# Patient Record
Sex: Female | Born: 2000 | Race: White | Hispanic: No | Marital: Single | Smoking: Never smoker
Health system: Southern US, Community
[De-identification: ages and names within clinical notes are randomized; demographics above are authoritative.]

## PROBLEM LIST (undated history)

## (undated) DIAGNOSIS — F909 Attention-deficit hyperactivity disorder, unspecified type: Secondary | ICD-10-CM

## (undated) DIAGNOSIS — F32A Depression, unspecified: Secondary | ICD-10-CM

---

## 2021-11-21 ENCOUNTER — Emergency Department
Admission: EM | Admit: 2021-11-21 | Discharge: 2021-11-21 | Disposition: A | Discharge status: Other Acute Inpatient Hospital | Payer: Medicaid Other | Attending: Emergency Medicine | Admitting: Emergency Medicine

## 2021-11-21 ENCOUNTER — Other Ambulatory Visit: Payer: Self-pay

## 2021-11-21 DIAGNOSIS — Z91010 Allergy to peanuts: Secondary | ICD-10-CM | POA: Insufficient documentation

## 2021-11-21 DIAGNOSIS — J029 Acute pharyngitis, unspecified: Secondary | ICD-10-CM | POA: Insufficient documentation

## 2021-11-21 MED ORDER — ACETAMINOPHEN 500 MG PO TABS
1000.0000 mg | ORAL_TABLET | Freq: Once | ORAL | Status: AC
  Administered 2021-11-21: 1000 mg via ORAL
  Filled 2021-11-21: qty 2

## 2021-11-21 NOTE — ED Provider Notes (Signed)
? ?[facility] ?Provider Note ? ? ? Event Date/Time  ? First MD Initiated Contact with Patient 11/21/21 1834   ?  (approximate) ? ? ?History  ? ?Allergic Reaction ? ? ?HPI ? ?Deborah Hanson is a 21 y.o. female with a past medical history of autism spectrum disorder, depression, anxiety and reported known peanut allergy who presents for evaluation of sore throat.  Patient states she thinks she was from a cracker she had yesterday had peanut butter on it.  It seems she did not make caregivers aware of until today.  She denies any other clear associated sick symptoms including nausea, vomiting, diarrhea, abdominal pain, chest pain, cough, shortness of breath, wheezing, rash or any itching.  Denies any earache, headache or any other sick symptoms other than sore throat which she states started yesterday around when she ate the cracker with peanut butter.  No medications prior to arrival.  No other concerns at this time from caregiver at bedside. ? ?  ? ? ?Physical Exam  ?Triage Vital Signs: ?ED Triage Vitals  ?Enc Vitals Group  ?   BP 11/21/21 1825 (!) 146/85  ?   Pulse Rate 11/21/21 1825 93  ?   Resp 11/21/21 1825 17  ?   Temp 11/21/21 1825 98 ?F (36.7 ?C)  ?   Temp src --   ?   SpO2 11/21/21 1825 100 %  ?   Weight --   ?   Height 11/21/21 1824 5' (1.524 m)  ?   Head Circumference --   ?   Peak Flow --   ?   Pain Score 11/21/21 1823 0  ?   Pain Loc --   ?   Pain Edu? --   ?   Excl. in [place]? --   ? ? ?Most recent vital signs: ?Vitals:  ? 11/21/21 1825  ?BP: (!) 146/85  ?Pulse: 93  ?Resp: 17  ?Temp: 98 ?F (36.7 ?C)  ?SpO2: 100%  ? ? ?General: Awake, no distress.  ?CV:  Good peripheral perfusion.  ?Resp:  Normal effort.  Clear bilaterally without any wheezing rhonchi or rales.  There is no stridor over the neck. ?Abd:  No distention.  Soft throughout. ?Other:  No rash visible in the upper chest neck lower extremities.  Oropharynx is some mild posterior oropharyngeal erythema but otherwise is  unremarkable. ? ? ?ED Results / Procedures / Treatments  ?Labs ?(all labs ordered are listed, but only abnormal results are displayed) ?Labs Reviewed  ?GROUP A STREP BY PCR  ? ? ? ?EKG ? ? ?RADIOLOGY ? ? ? ?PROCEDURES: ? ?Critical Care performed: No ? ?Procedures ? ? ?MEDICATIONS ORDERED IN ED: ?Medications  ?acetaminophen (TYLENOL) tablet 1,000 mg (1,000 mg Oral Given 11/21/21 1924)  ? ? ? ?IMPRESSION / MDM / ASSESSMENT AND PLAN / ED COURSE  ?I reviewed the triage vital signs and the nursing notes. ?             ?               ? ?Differential diagnosis includes, but is not limited to possible allergic reaction although seems less likely, small posterior pharyngeal tear from short cracker that I am unable to visualize versus possible strep or viral pharyngitis.  Patient otherwise very well-appearing and has full range of motion of her neck and have a very low suspicion for retropharyngeal abscess, peritonsillar abscess or other deep space infection head or neck.  Low suspicion at this time  for sepsis or anaphylaxis.  Patient and caregiver are not interested in strep screen.  I have a low suspicion for any other acute life-threatening process and on reassessment patient states he is feeling better.  I think it is reasonable to hold off on this as she has no fever, no exudates no cervical lymphadenopathy and overall my suspicion for strep pharyngitis is fairly low.  Advised to follow-up with PCP for any ongoing symptoms and have blood pressure rechecked.  No other acute concerns at this time.  Discharged in stable condition. ? ?  ? ? ?FINAL CLINICAL IMPRESSION(S) / ED DIAGNOSES  ? ?Final diagnoses:  ?Sore throat  ? ? ? ?Rx / DC Orders  ? ?ED Discharge Orders   ? ? None  ? ?  ? ? ? ?Note:  This document was prepared using Dragon voice recognition software and may include unintentional dictation errors. ?  ?Gilles Chiquito, MD ?11/21/21 1933 ? ?

## 2021-11-21 NOTE — ED Triage Notes (Signed)
Patient to ER via POV with caregiver. Patient reports she has an allergy to peanut butter and ate a peanut butter cracker yesterday. Reports now feeling bad. Patient reports having a bad headache and a sore throat. Denies rash or shortness of breath.  ?

## 2021-11-21 NOTE — ED Notes (Signed)
Pt and caregiver expressing wish to decline strep test and leave. This RN asked if she could give me a few minutes to speak to doctor and obtain D/C papers.  Caregiver agreed.  Pt states that throat feels better, Vital WNL, PT NAD at this time. ?

## 2021-11-21 NOTE — ED Notes (Signed)
Caregiver/ group home staff at [facility]. Verbalizes gave pt peanut butter crackers today. No h/o reaction to the same. Caregiver verbalizes this may be behavioral as she was mad at caregiver, but caregiver wante dto err on the side of precaution. Pt alert, NAD, calm, interactive, skin W&D, resps e/u, LS CTA, throat unremarkable, skin w/o rash or urticaria. Pt c/o mild sob, mild itching and mild sore throat.  ?

## 2021-11-27 ENCOUNTER — Ambulatory Visit
Admission: EM | Admit: 2021-11-27 | Discharge: 2021-11-27 | Disposition: A | Discharge status: Home / Self Care | Payer: Medicaid Other | Attending: Emergency Medicine | Admitting: Emergency Medicine

## 2021-11-27 ENCOUNTER — Other Ambulatory Visit: Payer: Self-pay

## 2021-11-27 DIAGNOSIS — J302 Other seasonal allergic rhinitis: Secondary | ICD-10-CM | POA: Insufficient documentation

## 2021-11-27 DIAGNOSIS — J029 Acute pharyngitis, unspecified: Secondary | ICD-10-CM | POA: Diagnosis present

## 2021-11-27 DIAGNOSIS — R490 Dysphonia: Secondary | ICD-10-CM | POA: Insufficient documentation

## 2021-11-27 DIAGNOSIS — R051 Acute cough: Secondary | ICD-10-CM | POA: Insufficient documentation

## 2021-11-27 DIAGNOSIS — F840 Autistic disorder: Secondary | ICD-10-CM | POA: Insufficient documentation

## 2021-11-27 HISTORY — DX: Autistic disorder: F84.0

## 2021-11-27 LAB — POCT RAPID STREP A (OFFICE): Rapid Strep A Screen: NEGATIVE

## 2021-11-27 MED ORDER — CETIRIZINE HCL 10 MG PO TABS: 10.0000 mg | ORAL_TABLET | Freq: Every day | ORAL | 1 refills | Status: DC

## 2021-11-27 MED ORDER — AZITHROMYCIN 250 MG PO TABS: ORAL_TABLET | ORAL | 0 refills | Status: AC

## 2021-11-27 MED ORDER — FLUTICASONE PROPIONATE 50 MCG/ACT NA SUSP: 2.0000 | Freq: Every day | NASAL | 1 refills | Status: AC

## 2021-11-27 NOTE — ED Provider Notes (Signed)
?[facility] ? ? ? ?CSN: [id number] ?Arrival date & time: 11/27/21  1008 ?  ? ?HISTORY  ? ?Chief Complaint  ?Patient presents with  ? Cough  ? Sore Throat  ? ?HPI ?Deborah Hanson is a 21 y.o. female with a past medical history of autism spectrum disorder, depression, anxiety and reported known peanut allergy who presents for evaluation of sore throat.  Patient was seen at [facility] ED 6 days ago with a chief complaint of sore throat.  At that ED visit, patient reported eating a cracker with peanut butter on it thinking that this was the cause of her sore throat.  At that time, patient denied nausea, vomiting, diarrhea, abdominal pain, chest pain, cough, shortness of breath, wheezing, rash or itching.  Patient also denied earache, headache or any other sick symptoms other than sore throat which she is dated began the day prior to being seen at [facility].  No testing was performed and no medications were prescribed at that ED visit due to patient and caregiver needing to leave before these could be done. ? ?Patient presents to urgent care today with her caregiver, patient reports that in addition to the sore throat which never improved, she is now having a cough and hoarseness of voice for the past 3 days.  Patient is not taking medications for her current symptoms.  Caregiver states she is unaware of patient having fever. ? ?The history is provided by the patient.  ?Past Medical History:  ?Diagnosis Date  ? Autism spectrum disorder 11/27/2021  ? ?Patient Active Problem List  ? Diagnosis Date Noted  ? Autism spectrum disorder 11/27/2021  ? ?History reviewed. No pertinent surgical history. ?OB History   ?No obstetric history on file. ?  ? ?Home Medications   ? ?Prior to Admission medications   ?Not on File  ? ?Family History ?History reviewed. No pertinent family history. ?Social History ?  ?Allergies   ?Peanut oil ? ?Review of Systems ?Review of Systems ?Pertinent findings noted in history of present illness.   ? ?Physical Exam ?Triage Vital Signs ?ED Triage Vitals  ?Enc Vitals Group  ?   BP 07/10/21 0827 (!) 147/82  ?   Pulse Rate 07/10/21 0827 72  ?   Resp 07/10/21 0827 18  ?   Temp 07/10/21 0827 98.3 ?F (36.8 ?C)  ?   Temp Source 07/10/21 0827 Oral  ?   SpO2 07/10/21 0827 98 %  ?   Weight --   ?   Height --   ?   Head Circumference --   ?   Peak Flow --   ?   Pain Score 07/10/21 0826 5  ?   Pain Loc --   ?   Pain Edu? --   ?   Excl. in [place]? --   ?No data found. ? ?Updated Vital Signs ?BP 135/80 (BP Location: Left Arm)   Pulse (!) 101   Temp 98.4 ?F (36.9 ?C) (Oral)   Resp 18   LMP  (LMP Unknown)   SpO2 96%  ? ?Physical Exam ?Constitutional:   ?   General: She is awake. She is not in acute distress. ?   Appearance: Normal appearance. She is well-developed. She is obese. She is ill-appearing. She is not toxic-appearing or diaphoretic.  ?HENT:  ?   Head: Normocephalic and atraumatic.  ?   Jaw: There is normal jaw occlusion.  ?   Salivary Glands: Right salivary gland is not diffusely enlarged or tender. Left  salivary gland is not diffusely enlarged or tender.  ?   Right Ear: Hearing, tympanic membrane, ear canal and external ear normal.  ?   Left Ear: Hearing, tympanic membrane, ear canal and external ear normal.  ?   Nose: Mucosal edema, congestion and rhinorrhea present. Rhinorrhea is clear.  ?   Right Turbinates: Enlarged.  ?   Left Turbinates: Enlarged.  ?   Right Sinus: No maxillary sinus tenderness or frontal sinus tenderness.  ?   Left Sinus: No maxillary sinus tenderness.  ?   Mouth/Throat:  ?   Mouth: Mucous membranes are moist.  ?   Pharynx: Uvula midline. Pharyngeal swelling, oropharyngeal exudate, posterior oropharyngeal erythema and uvula swelling present.  ?   Tonsils: No tonsillar exudate. 0 on the right. 0 on the left.  ?Cardiovascular:  ?   Rate and Rhythm: Regular rhythm. Tachycardia present.  ?   Pulses: Normal pulses.  ?Pulmonary:  ?   Effort: Pulmonary effort is normal. No tachypnea, bradypnea,  accessory muscle usage, prolonged expiration, respiratory distress or retractions.  ?   Breath sounds: No stridor. No wheezing, rhonchi or rales.  ?   Comments: Turbulent breath sounds throughout without wheeze, rale, rhonchi. ?Abdominal:  ?   General: Abdomen is flat. Bowel sounds are normal.  ?   Palpations: Abdomen is soft.  ?Musculoskeletal:     ?   General: Normal range of motion.  ?   Cervical back: Full passive range of motion without pain, normal range of motion and neck supple.  ?Lymphadenopathy:  ?   Cervical: No cervical adenopathy.  ?   Right cervical: No superficial or posterior cervical adenopathy. ?   Left cervical: No superficial or posterior cervical adenopathy.  ?Skin: ?   General: Skin is warm and dry.  ?Neurological:  ?   General: No focal deficit present.  ?   Mental Status: She is alert and oriented to person, place, and time.  ?   Motor: Motor function is intact.  ?   Coordination: Coordination is intact.  ?   Gait: Gait is intact.  ?   Deep Tendon Reflexes: Reflexes are normal and symmetric.  ?Psychiatric:     ?   Attention and Perception: Attention and perception normal.     ?   Mood and Affect: Mood and affect normal.     ?   Speech: Speech normal.     ?   Behavior: Behavior normal. Behavior is cooperative.     ?   Thought Content: Thought content normal.  ? ? ?Visual Acuity ?Right Eye Distance:   ?Left Eye Distance:   ?Bilateral Distance:   ? ?Right Eye Near:   ?Left Eye Near:    ?Bilateral Near:    ? ?UC Couse / Diagnostics / Procedures:  ?  ?EKG ? ?Radiology ?No results found. ? ?Procedures ?Procedures (including critical care time) ? ?UC Diagnoses / Final Clinical Impressions(s)   ?I have reviewed the triage vital signs and the nursing notes. ? ?Pertinent labs & imaging results that were available during my care of the patient were reviewed by me and considered in my medical decision making (see chart for details).   ?Final diagnoses:  ?Acute pharyngitis, unspecified etiology   ?Hoarseness of voice  ?Acute cough  ?Seasonal allergic rhinitis, unspecified trigger  ? ?Rapid strep test today is negative.  Throat culture will be performed per protocol.  Given duration of symptoms, recommend patient begin azithromycin to treat any bacterial superinfection on top  of what is most likely seasonal allergic rhinitis and possible viral infection.  Patient also advised to begin allergy medications.  Return precautions advised. ? ?ED Prescriptions   ? ? Medication Sig Dispense Auth. Provider  ? fluticasone (FLONASE) 50 MCG/ACT nasal spray Place 2 sprays into both nostrils daily. 48 mL Lynden Oxford Scales, PA-C  ? azithromycin (ZITHROMAX) 250 MG tablet Take 2 tablets (500 mg total) by mouth daily for 1 day, THEN 1 tablet (250 mg total) daily for 4 days. 6 tablet Lynden Oxford Scales, PA-C  ? cetirizine (ZYRTEC ALLERGY) 10 MG tablet Take 1 tablet (10 mg total) by mouth at bedtime. 90 tablet Lynden Oxford Scales, PA-C  ? ?  ? ?PDMP not reviewed this encounter. ? ?Pending results:  ?Labs Reviewed  ?COVID-19, FLU A+B NAA  ?CULTURE, GROUP A STREP [facility])  ?POCT RAPID STREP A (OFFICE)  ? ? ?Medications Ordered in UC: ?Medications - No data to display ? ?Disposition Upon Discharge:  ?Condition: stable for discharge home ?Home: take medications as prescribed; routine discharge instructions as discussed; follow up as advised. ? ?Patient presented with an acute illness with associated systemic symptoms and significant discomfort requiring urgent management. In my opinion, this is a condition that a prudent lay person (someone who possesses an average knowledge of health and medicine) may potentially expect to result in complications if not addressed urgently such as respiratory distress, impairment of bodily function or dysfunction of bodily organs.  ? ?Routine symptom specific, illness specific and/or disease specific instructions were discussed with the patient and/or caregiver at length.  ? ?As such, the  patient has been evaluated and assessed, work-up was performed and treatment was provided in alignment with urgent care protocols and evidence based medicine.  Patient/parent/caregiver has been advised that the patient may

## 2021-11-27 NOTE — ED Triage Notes (Addendum)
Patient identifiers and labels verified. ?Pt reports having a cough, hoarse voice and sore throat for 3 days. ?

## 2021-11-27 NOTE — Discharge Instructions (Addendum)
Your symptoms and physical exam findings are concerning for a viral respiratory infection.  You were tested for both COVID and influenza today, the result of your viral testing will be posted to your MyChart once it is complete, this typically takes 24 to 48 hours.  If there is a positive result, you will be contacted by phone with further recommendations, if any.  ? ?Your strep test today is negative.  Throat culture will be performed per our protocol.  The result of your throat culture will be posted to your MyChart once it is complete, this typically takes 3 to 5 days.  If there is a positive result, you will be contacted by phone and antibiotics will be prescribed for you. ?  ?Your symptoms and my physical exam findings are concerning for exacerbation of your underlying allergies.  I recommend that you begin allergy medications at this time to address any underlying allergies that might be worsening your symptoms as well as causing you to acquire more frequent upper respiratory infections.  I recommend that you continue allergy medications through the end of spring, around June.   ?  ? ?Please see the list below for recommended medications, dosages and frequencies to provide relief of your current symptoms:   ?  ?Azithromycin (Z-Pak): This is an antibiotic for upper respiratory infections.  Because you have had a sore throat for so many days, it is likely that any initial viral cause has evolved into a bacterial cause.  Please take 2 tablets the first day, then take 1 tablet daily every day thereafter until complete. ? ?Zyrtec (cetirizine): This is an excellent second-generation antihistamine that helps to reduce respiratory inflammatory response to environmental allergens.  In some patients, this medication can cause daytime sleepiness so I recommend that you take 1 tablet daily at bedtime.   ?  ?Flonase (fluticasone): This is a steroid nasal spray that you use once daily, 1 spray in each nare.  This medication  does not work well if you decide to use it only used as you feel you need to, it works best used on a daily basis.  After 3 to 5 days of use, you will notice significant reduction of the inflammation and mucus production that is currently being caused by exposure to allergens, whether seasonal or environmental.  The most common side effect of this medication is nosebleeds.  If you experience a nosebleed, please discontinue use for 1 week, then feel free to resume.  I have provided you with a prescription but you can also purchase this medication over-the-counter if your insurance will not cover it. ?  ?  ?Conservative care is also recommended at this time.  This includes rest, pushing clear fluids and activity as tolerated.  Warm beverages such as teas and broths versus cold beverages/popsicles and frozen sherbet/sorbet are your choice, both warm and cold are beneficial.  You may also notice that your appetite is reduced; this is okay as long as you are drinking plenty of clear fluids.  ?  ?If you find that you have not had significant relief of your symptoms in the next 7 to 10 days, please follow-up with your primary care provider ?  ?Thank you for visiting urgent care today.  We appreciate the opportunity to participate in your care. ? ?

## 2021-11-30 ENCOUNTER — Other Ambulatory Visit: Payer: Self-pay

## 2021-11-30 ENCOUNTER — Emergency Department (HOSPITAL_COMMUNITY)
Admission: EM | Admit: 2021-11-30 | Discharge: 2021-12-01 | Disposition: A | Discharge status: Home / Self Care | Payer: Medicaid Other | Attending: Emergency Medicine | Admitting: Emergency Medicine

## 2021-11-30 ENCOUNTER — Emergency Department (HOSPITAL_COMMUNITY): Payer: Medicaid Other

## 2021-11-30 ENCOUNTER — Encounter (HOSPITAL_COMMUNITY): Payer: Self-pay

## 2021-11-30 DIAGNOSIS — F332 Major depressive disorder, recurrent severe without psychotic features: Secondary | ICD-10-CM | POA: Insufficient documentation

## 2021-11-30 DIAGNOSIS — F840 Autistic disorder: Secondary | ICD-10-CM | POA: Diagnosis not present

## 2021-11-30 DIAGNOSIS — M25511 Pain in right shoulder: Secondary | ICD-10-CM | POA: Diagnosis not present

## 2021-11-30 DIAGNOSIS — S0081XA Abrasion of other part of head, initial encounter: Secondary | ICD-10-CM | POA: Insufficient documentation

## 2021-11-30 DIAGNOSIS — R45851 Suicidal ideations: Secondary | ICD-10-CM | POA: Diagnosis not present

## 2021-11-30 DIAGNOSIS — Z91010 Allergy to peanuts: Secondary | ICD-10-CM | POA: Insufficient documentation

## 2021-11-30 DIAGNOSIS — S00511A Abrasion of lip, initial encounter: Secondary | ICD-10-CM | POA: Diagnosis not present

## 2021-11-30 DIAGNOSIS — S0990XA Unspecified injury of head, initial encounter: Secondary | ICD-10-CM | POA: Diagnosis present

## 2021-11-30 DIAGNOSIS — M25531 Pain in right wrist: Secondary | ICD-10-CM | POA: Diagnosis not present

## 2021-11-30 DIAGNOSIS — W228XXA Striking against or struck by other objects, initial encounter: Secondary | ICD-10-CM | POA: Insufficient documentation

## 2021-11-30 LAB — COMPREHENSIVE METABOLIC PANEL
ALT: 24 U/L (ref 0–44)
AST: 25 U/L (ref 15–41)
Albumin: 4.3 g/dL (ref 3.5–5.0)
Alkaline Phosphatase: 49 U/L (ref 38–126)
Anion gap: 12 (ref 5–15)
BUN: 8 mg/dL (ref 6–20)
CO2: 25 mmol/L (ref 22–32)
Calcium: 9.4 mg/dL (ref 8.9–10.3)
Chloride: 100 mmol/L (ref 98–111)
Creatinine, Ser: 0.59 mg/dL (ref 0.44–1.00)
GFR, Estimated: >60 mL/min (ref >60)
Glucose, Bld: 116 mg/dL — ABNORMAL HIGH (ref 70–99)
Potassium: 4 mmol/L (ref 3.5–5.1)
Sodium: 137 mmol/L (ref 135–145)
Total Bilirubin: 0.3 mg/dL (ref 0.3–1.2)
Total Protein: 8.1 g/dL (ref 6.5–8.1)

## 2021-11-30 LAB — CULTURE, GROUP A STREP (THRC)

## 2021-11-30 LAB — SALICYLATE LEVEL: Salicylate Lvl: <7 mg/dL — ABNORMAL LOW (ref 7.0–30.0)

## 2021-11-30 LAB — I-STAT BETA HCG BLOOD, ED (MC, WL, AP ONLY): I-stat hCG, quantitative: <5 m[IU]/mL (ref <5)

## 2021-11-30 LAB — CBC
HCT: 41.8 % (ref 36.0–46.0)
Hemoglobin: 13.2 g/dL (ref 12.0–15.0)
MCH: 28 pg (ref 26.0–34.0)
MCHC: 31.6 g/dL (ref 30.0–36.0)
MCV: 88.6 fL (ref 80.0–100.0)
Platelets: 362 10*3/uL (ref 150–400)
RBC: 4.72 MIL/uL (ref 3.87–5.11)
RDW: 13.4 % (ref 11.5–15.5)
WBC: 6 10*3/uL (ref 4.0–10.5)
nRBC: 0 % (ref 0.0–0.2)

## 2021-11-30 LAB — ETHANOL: Alcohol, Ethyl (B): <10 mg/dL (ref <10)

## 2021-11-30 LAB — ACETAMINOPHEN LEVEL: Acetaminophen (Tylenol), Serum: <10 ug/mL — ABNORMAL LOW (ref 10–30)

## 2021-11-30 NOTE — ED Notes (Signed)
Caregiver would like a call regarding if her client will need to be discharged or not so that she can pick her up ?Deborah Hanson : [phone] ?
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
## 2021-12-01 LAB — COVID-19, FLU A+B NAA
Influenza A, NAA: NOT DETECTED
Influenza B, NAA: NOT DETECTED
SARS-CoV-2, NAA: NOT DETECTED

## 2021-12-01 MED ORDER — ACETAMINOPHEN 325 MG PO TABS
650.0000 mg | ORAL_TABLET | Freq: Once | ORAL | Status: AC
  Administered 2021-12-01: 650 mg via ORAL
  Filled 2021-12-01: qty 2

## 2021-12-01 NOTE — ED Notes (Addendum)
Pt. Unable to give urine at this time. 
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
## 2021-12-01 NOTE — Discharge Instructions (Signed)
For your behavioral health needs you are advised to continue treatment with [facility].  Your next appointment is scheduled for December 08, 2019: ? ?     [facility] ?     [address] ?     [place], [place] [address] ?     [phone] ? ? ?

## 2021-12-01 NOTE — ED Notes (Signed)
Pt.made aware of urine need ?

## 2021-12-01 NOTE — ED Notes (Addendum)
Pt. In burgundy scrubs and wanded by secuirty.pt. has 1 belongings bag. Pt. Has 1 pr. Black flip flops, 1 pr. White socks, 1 white sports bra, 1 blue and 1 gray pant. Pt. Has no cell phone or purse. Pt. Belongings locked up in cabinet behind the nurses station between [address] and [address]. ?

## 2021-12-01 NOTE — BH Assessment (Signed)
BHH Assessment Progress Note ?  ?Per Caryn Bee, NP , this voluntary pt does not require psychiatric hospitalization at this time.  Pt is psychiatrically cleared.  Pt reportedly has an appointment scheduled for December 07, 2021 at [facility].  Discharge instructions advise her to keep this appointment.  I have spoken to Damian Leavell at pt's residential facility.  He reports that staff will be here to pick pt up at 17:00 and will bring a copy of letter of guardianship identifying Deborah Hanson as guardian.  At 16:32 I called the guardian to notify her.  Call rolled to voice mail and I left a message.  EDP Kristine Royal, MD and pt's nurse, Casimiro Needle, have been notified. ? ?[name], MA ?Triage Specialist ?[phone] ? ?

## 2021-12-01 NOTE — BH Assessment (Addendum)
Disposition:  ? ?TTS assessment completed. Discussed clinicals with the [facility] provider Caryn Bee, DNP) and discharge has been recommended at this time. Patient is psych cleared to return to her residential home [facility]). ? ?I spoke to the Director of [employer], Damian Leavell #[phone], and he is on board with patient's discharge and return to the residential home. Although, Sharlyne Pacas is the director, [facility] [phone] is the direct caregiver. Brekyn lives in the home with [place].  ? ?Marylee Floras is agreeable to pick patient up from [facility], at 5pm today. Patient has expressed multiple times in my assessment that she doesn't want to live with Marylee Floras anymore. FYI: Patient may display some acting out behaviors as the result of being discharged back to [facility]. Due to patient's diagnosis of Autism, patient has significant issues with transitioning environment to environment and sometimes needs additional support.  ? ?I have discussed with Marylee Floras and Sharlyne Pacas recommendations for follow up treatment options. Patient has Medicaid with [facility]. Therefore, patient is recommended to follow up with outpatient services. She may be a candidate for providers that provide ACTT services, [employer], and outpatient therapy under the [facility]. I encouraged Marylee Floras and Sharlyne Pacas to follow up with patient's Care Coordinator @ [facility] to link appropriate services. Patient already has a medication management appointment scheduled with [facility] December 07, 2021 and a Care Coordinator with [facility]. Tom, please note these recommendations on patient's AVS for discharge. In addition, to [facility]'s crises services.  ? ?I have contacted the Department of Social Services with [place] to provide updates to patient's guardian: @1345 , Contacted patient's guardian as listed on her face sheet, Rosetta) [phone], no answer. Therefore, left a HIPPA compliant  voicemail, requested a return call regarding a mutual patient. @1353 , Contacted patient's guardian as listed on her face sheet, #[phone], no answer. Therefore, left a HIPPA compliant voicemail. Requested a return call regarding a mutual patient. The voicemails for Cherlyn Roberts and #[phone] both listed an Emergency number to call #[phone] if there is a need to contact a representative. Clinician called the number provider, spoke to the switch board operator, ?Genie? @1358 . She will contact patient's designated guardian. However, she did confirm that Cherlyn Roberts is out on medical leave for an extended amount of time. Also, verified that [phone] is not patient's direct guardian, however; the supervisor for all the guardians at the [facility]. Clinician is awaiting a return call.   ?

## 2021-12-01 NOTE — BH Assessment (Signed)
@  1254, requested patient's nurse Levada Dy, RN) to set up the TTS machine for patient's initial TTS assessment.  ?

## 2022-02-16 ENCOUNTER — Encounter: Payer: Self-pay | Admitting: Emergency Medicine

## 2022-02-16 ENCOUNTER — Ambulatory Visit
Admission: EM | Admit: 2022-02-16 | Discharge: 2022-02-16 | Disposition: A | Discharge status: Home / Self Care | Payer: Medicaid Other

## 2022-02-16 DIAGNOSIS — R4586 Emotional lability: Secondary | ICD-10-CM | POA: Diagnosis not present

## 2022-02-16 DIAGNOSIS — R040 Epistaxis: Secondary | ICD-10-CM

## 2022-02-16 DIAGNOSIS — R638 Other symptoms and signs concerning food and fluid intake: Secondary | ICD-10-CM

## 2022-02-16 NOTE — ED Provider Notes (Signed)
[facility]    CSN: [id number] Arrival date & time: 02/16/22  0920      History   Chief Complaint Chief Complaint  Patient presents with   Anorexia   Epistaxis    HPI Deanne Bedgood is a 21 y.o. female.  She is here with her legal guardian Marylee Floras.  Archie Patten brings Oral here today for concerns of nosebleed and decreased appetite and concern for constipation and dehydration.  Guardian reports that about a month ago, patient's medications were changed by her psychiatrist and since then, patient has had decreased appetite and interest in eating, decreased oral intake including fluids, and change in behavior to be more weepy.  Patient has an appointment with psychiatrist in 2 days to address these concerns.  Patient also saw her PCP last week and had a variety of lab tests done, guardian reports they have an appointment tomorrow to get results and discuss plan of care with PCP.  Guardian reports that to her knowledge, patient does not have bowel movements and she is worried the patient has constipation.  The guardian reports her urine is dark orange color.  She was able to get patient to drink some Pedialyte last night.  Guardian is also concerned because patient recently had a nosebleed.  Nosebleed was not a copious amount of blood just a little amount of blood but it is new guardian is concerned.  Guardian also reports that patient is picking at her skin frequently which is a change since her medications were changed a month ago.  I attempted to ask patient some questions about her symptoms but patient answered yes to every question and I suspect she is not able to reliably tell me how she is feeling.   Epistaxis  Past Medical History:  Diagnosis Date   Autism spectrum disorder 11/27/2021    Patient Active Problem List   Diagnosis Date Noted   Autism spectrum disorder 11/27/2021    No past surgical history on file.  OB History   No obstetric history on file.      Home  Medications    Prior to Admission medications   Medication Sig Start Date End Date Taking? Authorizing Provider  ARIPiprazole (ABILIFY) 20 MG tablet Take 20 mg by mouth at bedtime.    [provider]  benztropine (COGENTIN) 0.5 MG tablet Take 0.5 mg by mouth at bedtime as needed for tremors (extrapyramidal symptoms).    [provider]  cetirizine (ZYRTEC ALLERGY) 10 MG tablet Take 1 tablet (10 mg total) by mouth at bedtime. Patient taking differently: Take 10 mg by mouth daily. 11/27/21 05/26/22  Theadora Rama Scales, PA-C  divalproex (DEPAKOTE) 250 MG DR tablet Take 250 mg by mouth 2 (two) times daily.    [provider]  FLUoxetine (PROZAC) 20 MG capsule Take by mouth daily. 01/08/22   [provider]  fluticasone (FLONASE) 50 MCG/ACT nasal spray Place 2 sprays into both nostrils daily. Patient not taking: Reported on 11/30/2021 11/27/21   Theadora Rama Scales, PA-C  haloperidol (HALDOL) 5 MG tablet Take 7.5 mg by mouth daily. 01/08/22   [provider]  hydrOXYzine (ATARAX) 50 MG tablet Take 50 mg by mouth 3 (three) times daily as needed for anxiety.    [provider]  hydrOXYzine (VISTARIL) 50 MG capsule Take 50 mg by mouth 2 (two) times daily. 01/28/22   [provider]  LORazepam (ATIVAN) 0.5 MG tablet Take 0.5 mg by mouth 3 (three) times daily as needed. 01/28/22  [provider]  traZODone (DESYREL) 50 MG tablet Take 75 mg by mouth at bedtime.    [provider]    Family History No family history on file.  Social History Social History   Tobacco Use   Smoking status: Never   Smokeless tobacco: Never  Substance Use Topics   Alcohol use: Never   Drug use: Never     Allergies   Lactose intolerance (gi) and Peanut oil   Review of Systems Review of Systems  HENT:  Positive for nosebleeds.     Physical Exam Triage Vital Signs ED Triage Vitals [02/16/22 0940]  Enc Vitals Group     BP (!)  117/92     Pulse Rate 100     Resp 20     Temp 97.8 F (36.6 C)     Temp src      SpO2 94 %     Weight      Height      Head Circumference      Peak Flow      Pain Score      Pain Loc      Pain Edu?      Excl. in GC?    No data found.  Updated Vital Signs BP (!) 117/92   Pulse 100   Temp 97.8 F (36.6 C)   Resp 20   LMP  (LMP Unknown)   SpO2 94%   Visual Acuity Right Eye Distance:   Left Eye Distance:   Bilateral Distance:    Right Eye Near:   Left Eye Near:    Bilateral Near:     Physical Exam Constitutional:      Appearance: Normal appearance.     Comments: Occasionally weepy.  HENT:     Head: Normocephalic and atraumatic.     Nose:     Comments: I am not able to use a speculum to examine patient's nasal mucosa.  Just looking at her external nares, patient does have some dried blood around the right nare and some dried nasal discharge around that nare.    Mouth/Throat:     Mouth: Mucous membranes are moist.     Comments: White debris in patient's mouth.  Not sure if this is from disintegrating medications and guardian suggests or simply from poor oral hygiene-Guardian reports she is working on a plan for oral hygiene for patient Cardiovascular:     Rate and Rhythm: Normal rate and regular rhythm.  Pulmonary:     Effort: Pulmonary effort is normal.     Breath sounds: Normal breath sounds.  Abdominal:     General: Abdomen is flat. Bowel sounds are normal.     Palpations: Abdomen is soft.     Tenderness: There is generalized abdominal tenderness. There is no guarding or rebound.  Neurological:     Mental Status: She is alert.     UC Treatments / Results  Labs (all labs ordered are listed, but only abnormal results are displayed) Labs Reviewed - No data to display  EKG   Radiology No results found.  Procedures Procedures (including critical care time)  Medications Ordered in UC Medications - No data to display  Initial Impression /  Assessment and Plan / UC Course  I have reviewed the triage vital signs and the nursing notes.  Pertinent labs & imaging results that were available during my care of the patient were reviewed by me and considered in my medical decision making (see chart for details).  No sign of any acute urgent or emergent problems.  Patient's abdomen is soft and while she tells me palpating her abdomen hurts everywhere I touch, she does not appear to be in pain or distress when I palpate her abdomen.  Mucous membranes are moist, I suspect she is sufficiently hydrated at this point.  Reviewed oral care strategies with guardian.  Discussed need to increase fluids and fiber if concern for constipation.  Discussed strategies for sneaking in fiber into diet.  Keep appointment with PCP tomorrow keep appointment with psychiatrist in 2 days.  Reviewed seeking emergent care for uncontrolled epistaxis.  Guardian is not certain but it may be that patient is picking her nose.  Final Clinical Impressions(s) / UC Diagnoses   Final diagnoses:  Alteration in appetite  Epistaxis  Mood change     Discharge Instructions      Keep follow up appointments with PCP and psychiatrist.   Occasional nosebleed with small amount of blood is probably okay.  Can happen if she is congested or if she is picking her nose.  It can also happen if her nasal tissue is dry.  Only worry if it is a large nosebleed that does not stop  Try to use mouth swabs to clean Lavita's mouth every day.  If needed you can give it in kids flavored mouthwash.  If she will drink anything else and you are concerned about dehydration, you can give her Pedialyte.  If you want to sneak some fiber into her diet, you can mix Metamucil (or Benefiber or other fiber supplement) into the Pedialyte.  Talk with her PCP about her diet.  If she truly will not eat any worried about malnutrition, you can maybe use an occasional Ensure to make sure she is getting calories  and nutrients.  You can also mix Metamucil into the Ensure if needed.  If Vasilia begins vomiting or develops high fever, seek care in the emergency room.  Otherwise keep the follow-up appointments for her scheduled     ED Prescriptions   None    PDMP not reviewed this encounter.   Cathlyn ParsonsKabbe, Doran Nestle M, NP 02/16/22 1141

## 2022-02-16 NOTE — Discharge Instructions (Signed)
Keep follow up appointments with PCP and psychiatrist.   Occasional nosebleed with small amount of blood is probably okay.  Can happen if she is congested or if she is picking her nose.  It can also happen if her nasal tissue is dry.  Only worry if it is a large nosebleed that does not stop  Try to use mouth swabs to clean Berlin's mouth every day.  If needed you can give it in kids flavored mouthwash.  If she will drink anything else and you are concerned about dehydration, you can give her Pedialyte.  If you want to sneak some fiber into her diet, you can mix Metamucil (or Benefiber or other fiber supplement) into the Pedialyte.  Talk with her PCP about her diet.  If she truly will not eat any worried about malnutrition, you can maybe use an occasional Ensure to make sure she is getting calories and nutrients.  You can also mix Metamucil into the Ensure if needed.  If Elisabella begins vomiting or develops high fever, seek care in the emergency room.  Otherwise keep the follow-up appointments for her scheduled

## 2022-02-16 NOTE — ED Triage Notes (Signed)
Pt here with recent self-harming, not eating for drinking, and frequent nose bleeds for a little over a week. Pt guardian from her group home states that a recent med change seemed to have caused these issues. Pt is autistic, but states her entire body hurts. Mucous membranes are pale and dry and she is tearful in triage.

## 2022-03-08 ENCOUNTER — Encounter (HOSPITAL_COMMUNITY): Payer: Self-pay

## 2022-03-08 ENCOUNTER — Emergency Department (HOSPITAL_COMMUNITY)
Admission: EM | Admit: 2022-03-08 | Discharge: 2022-03-08 | Disposition: A | Discharge status: Home / Self Care | Payer: Medicaid Other | Attending: Student | Admitting: Student

## 2022-03-08 ENCOUNTER — Emergency Department (HOSPITAL_COMMUNITY): Payer: Medicaid Other

## 2022-03-08 ENCOUNTER — Other Ambulatory Visit: Payer: Self-pay

## 2022-03-08 DIAGNOSIS — F840 Autistic disorder: Secondary | ICD-10-CM | POA: Diagnosis not present

## 2022-03-08 DIAGNOSIS — Z91010 Allergy to peanuts: Secondary | ICD-10-CM | POA: Diagnosis not present

## 2022-03-08 DIAGNOSIS — N3000 Acute cystitis without hematuria: Secondary | ICD-10-CM | POA: Insufficient documentation

## 2022-03-08 DIAGNOSIS — R112 Nausea with vomiting, unspecified: Secondary | ICD-10-CM | POA: Diagnosis present

## 2022-03-08 HISTORY — DX: Depression, unspecified: F32.A

## 2022-03-08 HISTORY — DX: Attention-deficit hyperactivity disorder, unspecified type: F90.9

## 2022-03-08 LAB — COMPREHENSIVE METABOLIC PANEL
ALT: 16 U/L (ref 0–44)
AST: 19 U/L (ref 15–41)
Albumin: 3.6 g/dL (ref 3.5–5.0)
Alkaline Phosphatase: 55 U/L (ref 38–126)
Anion gap: 11 (ref 5–15)
BUN: 7 mg/dL (ref 6–20)
CO2: 25 mmol/L (ref 22–32)
Calcium: 9.7 mg/dL (ref 8.9–10.3)
Chloride: 103 mmol/L (ref 98–111)
Creatinine, Ser: 0.63 mg/dL (ref 0.44–1.00)
GFR, Estimated: >60 mL/min (ref >60)
Glucose, Bld: 97 mg/dL (ref 70–99)
Potassium: 3.8 mmol/L (ref 3.5–5.1)
Sodium: 139 mmol/L (ref 135–145)
Total Bilirubin: 1 mg/dL (ref 0.3–1.2)
Total Protein: 7.2 g/dL (ref 6.5–8.1)

## 2022-03-08 LAB — URINALYSIS, ROUTINE W REFLEX MICROSCOPIC
Glucose, UA: NEGATIVE mg/dL
Hgb urine dipstick: NEGATIVE
Ketones, ur: 20 mg/dL — AB
Nitrite: NEGATIVE
Protein, ur: 100 mg/dL — AB
Specific Gravity, Urine: 1.031 — ABNORMAL HIGH (ref 1.005–1.030)
WBC, UA: >50 WBC/hpf — ABNORMAL HIGH (ref 0–5)
pH: 5 (ref 5.0–8.0)

## 2022-03-08 LAB — CBC
HCT: 41.6 % (ref 36.0–46.0)
Hemoglobin: 13.3 g/dL (ref 12.0–15.0)
MCH: 28.3 pg (ref 26.0–34.0)
MCHC: 32 g/dL (ref 30.0–36.0)
MCV: 88.5 fL (ref 80.0–100.0)
Platelets: 233 10*3/uL (ref 150–400)
RBC: 4.7 MIL/uL (ref 3.87–5.11)
RDW: 14.4 % (ref 11.5–15.5)
WBC: 7 10*3/uL (ref 4.0–10.5)
nRBC: 0 % (ref 0.0–0.2)

## 2022-03-08 LAB — I-STAT BETA HCG BLOOD, ED (MC, WL, AP ONLY): I-stat hCG, quantitative: <5 m[IU]/mL (ref <5)

## 2022-03-08 LAB — LIPASE, BLOOD: Lipase: 33 U/L (ref 11–51)

## 2022-03-08 MED ORDER — IOHEXOL 300 MG/ML  SOLN
100.0000 mL | Freq: Once | INTRAMUSCULAR | Status: AC | PRN
  Administered 2022-03-08: 80 mL via INTRAVENOUS

## 2022-03-08 MED ORDER — ONDANSETRON HCL 4 MG PO TABS: 4.0000 mg | ORAL_TABLET | Freq: Four times a day (QID) | ORAL | 0 refills | Status: AC

## 2022-03-08 MED ORDER — NITROFURANTOIN MONOHYD MACRO 100 MG PO CAPS: 100.0000 mg | ORAL_CAPSULE | Freq: Two times a day (BID) | ORAL | 0 refills | Status: AC

## 2022-03-08 MED ORDER — SODIUM CHLORIDE 0.9 % IV BOLUS
1000.0000 mL | Freq: Once | INTRAVENOUS | Status: AC
  Administered 2022-03-08: 1000 mL via INTRAVENOUS

## 2022-03-08 MED ORDER — ONDANSETRON HCL 4 MG/2ML IJ SOLN
4.0000 mg | Freq: Once | INTRAMUSCULAR | Status: AC
  Administered 2022-03-08: 4 mg via INTRAVENOUS
  Filled 2022-03-08: qty 2

## 2022-03-08 MED ORDER — FAMOTIDINE IN NACL 20-0.9 MG/50ML-% IV SOLN
20.0000 mg | Freq: Once | INTRAVENOUS | Status: AC
  Administered 2022-03-08: 20 mg via INTRAVENOUS
  Filled 2022-03-08: qty 50
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
## 2022-03-24 ENCOUNTER — Emergency Department (HOSPITAL_COMMUNITY): Payer: Medicaid Other

## 2022-03-24 ENCOUNTER — Other Ambulatory Visit: Payer: Self-pay

## 2022-03-24 ENCOUNTER — Encounter (HOSPITAL_COMMUNITY): Payer: Self-pay

## 2022-03-24 ENCOUNTER — Ambulatory Visit (HOSPITAL_COMMUNITY): Admit: 2022-03-24 | Payer: No Typology Code available for payment source

## 2022-03-24 ENCOUNTER — Emergency Department (HOSPITAL_COMMUNITY)
Admission: EM | Admit: 2022-03-24 | Discharge: 2022-03-30 | Disposition: A | Discharge status: Home / Self Care | Payer: Medicaid Other | Attending: Emergency Medicine | Admitting: Emergency Medicine

## 2022-03-24 ENCOUNTER — Ambulatory Visit (HOSPITAL_COMMUNITY)
Admission: EM | Admit: 2022-03-24 | Discharge: 2022-03-24 | Disposition: A | Discharge status: Home / Self Care | Payer: No Typology Code available for payment source | Source: Ambulatory Visit | Attending: Emergency Medicine | Admitting: Emergency Medicine

## 2022-03-24 ENCOUNTER — Ambulatory Visit (HOSPITAL_COMMUNITY)
Admission: EM | Admit: 2022-03-24 | Discharge: 2022-03-24 | Disposition: A | Discharge status: Home / Self Care | Payer: Medicaid Other | Source: Ambulatory Visit | Attending: Emergency Medicine | Admitting: Emergency Medicine

## 2022-03-24 DIAGNOSIS — F840 Autistic disorder: Secondary | ICD-10-CM | POA: Diagnosis not present

## 2022-03-24 DIAGNOSIS — S0990XA Unspecified injury of head, initial encounter: Secondary | ICD-10-CM | POA: Diagnosis present

## 2022-03-24 DIAGNOSIS — S7012XA Contusion of left thigh, initial encounter: Secondary | ICD-10-CM | POA: Insufficient documentation

## 2022-03-24 DIAGNOSIS — S8012XA Contusion of left lower leg, initial encounter: Secondary | ICD-10-CM | POA: Diagnosis not present

## 2022-03-24 DIAGNOSIS — Y9289 Other specified places as the place of occurrence of the external cause: Secondary | ICD-10-CM | POA: Diagnosis not present

## 2022-03-24 DIAGNOSIS — S7001XA Contusion of right hip, initial encounter: Secondary | ICD-10-CM | POA: Insufficient documentation

## 2022-03-24 DIAGNOSIS — S7011XA Contusion of right thigh, initial encounter: Secondary | ICD-10-CM | POA: Insufficient documentation

## 2022-03-24 DIAGNOSIS — R4689 Other symptoms and signs involving appearance and behavior: Secondary | ICD-10-CM

## 2022-03-24 DIAGNOSIS — R8289 Other abnormal findings on cytological and histological examination of urine: Secondary | ICD-10-CM | POA: Insufficient documentation

## 2022-03-24 DIAGNOSIS — S50812A Abrasion of left forearm, initial encounter: Secondary | ICD-10-CM | POA: Insufficient documentation

## 2022-03-24 DIAGNOSIS — S40212A Abrasion of left shoulder, initial encounter: Secondary | ICD-10-CM | POA: Diagnosis not present

## 2022-03-24 DIAGNOSIS — S5001XA Contusion of right elbow, initial encounter: Secondary | ICD-10-CM | POA: Diagnosis not present

## 2022-03-24 DIAGNOSIS — S8011XA Contusion of right lower leg, initial encounter: Secondary | ICD-10-CM | POA: Insufficient documentation

## 2022-03-24 DIAGNOSIS — S5002XA Contusion of left elbow, initial encounter: Secondary | ICD-10-CM | POA: Diagnosis not present

## 2022-03-24 DIAGNOSIS — S7002XA Contusion of left hip, initial encounter: Secondary | ICD-10-CM | POA: Insufficient documentation

## 2022-03-24 DIAGNOSIS — Z0441 Encounter for examination and observation following alleged adult rape: Secondary | ICD-10-CM | POA: Diagnosis present

## 2022-03-24 DIAGNOSIS — M25561 Pain in right knee: Secondary | ICD-10-CM | POA: Diagnosis not present

## 2022-03-24 DIAGNOSIS — S0181XA Laceration without foreign body of other part of head, initial encounter: Secondary | ICD-10-CM | POA: Insufficient documentation

## 2022-03-24 DIAGNOSIS — F419 Anxiety disorder, unspecified: Secondary | ICD-10-CM | POA: Diagnosis not present

## 2022-03-24 DIAGNOSIS — Z91010 Allergy to peanuts: Secondary | ICD-10-CM | POA: Diagnosis not present

## 2022-03-24 DIAGNOSIS — Z8659 Personal history of other mental and behavioral disorders: Secondary | ICD-10-CM

## 2022-03-24 LAB — URINALYSIS, ROUTINE W REFLEX MICROSCOPIC
Glucose, UA: 100 mg/dL — AB
Ketones, ur: 15 mg/dL — AB
Nitrite: POSITIVE — AB
Protein, ur: 100 mg/dL — AB
Specific Gravity, Urine: 1.025 (ref 1.005–1.030)
pH: 6.5 (ref 5.0–8.0)

## 2022-03-24 LAB — URINALYSIS, MICROSCOPIC (REFLEX)

## 2022-03-24 LAB — CBC WITH DIFFERENTIAL/PLATELET
Abs Immature Granulocytes: 0.04 10*3/uL (ref 0.00–0.07)
Basophils Absolute: 0 10*3/uL (ref 0.0–0.1)
Basophils Relative: 0 %
Eosinophils Absolute: 0.1 10*3/uL (ref 0.0–0.5)
Eosinophils Relative: 2 %
HCT: 36.4 % (ref 36.0–46.0)
Hemoglobin: 11.4 g/dL — ABNORMAL LOW (ref 12.0–15.0)
Immature Granulocytes: 1 %
Lymphocytes Relative: 41 %
Lymphs Abs: 3 10*3/uL (ref 0.7–4.0)
MCH: 28.5 pg (ref 26.0–34.0)
MCHC: 31.3 g/dL (ref 30.0–36.0)
MCV: 91 fL (ref 80.0–100.0)
Monocytes Absolute: 0.7 10*3/uL (ref 0.1–1.0)
Monocytes Relative: 10 %
Neutro Abs: 3.3 10*3/uL (ref 1.7–7.7)
Neutrophils Relative %: 46 %
Platelets: 219 10*3/uL (ref 150–400)
RBC: 4 MIL/uL (ref 3.87–5.11)
RDW: 15.6 % — ABNORMAL HIGH (ref 11.5–15.5)
WBC: 7.2 10*3/uL (ref 4.0–10.5)
nRBC: 0.4 % — ABNORMAL HIGH (ref 0.0–0.2)

## 2022-03-24 LAB — COMPREHENSIVE METABOLIC PANEL
ALT: 32 U/L (ref 0–44)
AST: 46 U/L — ABNORMAL HIGH (ref 15–41)
Albumin: 3 g/dL — ABNORMAL LOW (ref 3.5–5.0)
Alkaline Phosphatase: 70 U/L (ref 38–126)
Anion gap: 8 (ref 5–15)
BUN: 14 mg/dL (ref 6–20)
CO2: 30 mmol/L (ref 22–32)
Calcium: 9 mg/dL (ref 8.9–10.3)
Chloride: 105 mmol/L (ref 98–111)
Creatinine, Ser: 0.68 mg/dL (ref 0.44–1.00)
GFR, Estimated: >60 mL/min (ref >60)
Glucose, Bld: 91 mg/dL (ref 70–99)
Potassium: 3.6 mmol/L (ref 3.5–5.1)
Sodium: 143 mmol/L (ref 135–145)
Total Bilirubin: 0.8 mg/dL (ref 0.3–1.2)
Total Protein: 6.1 g/dL — ABNORMAL LOW (ref 6.5–8.1)

## 2022-03-24 MED ORDER — ACETAMINOPHEN 325 MG PO TABS
650.0000 mg | ORAL_TABLET | Freq: Once | ORAL | Status: AC
  Administered 2022-03-24: 650 mg via ORAL
  Filled 2022-03-24: qty 2
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
## 2022-03-24 NOTE — ED Notes (Signed)
Patient transported to X-ray 
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
## 2022-03-25 LAB — HIV ANTIBODY (ROUTINE TESTING W REFLEX): HIV Screen 4th Generation wRfx: NONREACTIVE

## 2022-03-25 LAB — RPR: RPR Ser Ql: NONREACTIVE

## 2022-03-25 MED ORDER — HALOPERIDOL 5 MG PO TABS
7.5000 mg | ORAL_TABLET | Freq: Every day | ORAL | Status: DC
  Administered 2022-03-26 – 2022-03-30 (×5): 7.5 mg via ORAL
  Filled 2022-03-25 (×6): qty 2

## 2022-03-25 MED ORDER — HYDROXYZINE HCL 25 MG PO TABS
50.0000 mg | ORAL_TABLET | Freq: Two times a day (BID) | ORAL | Status: DC
  Administered 2022-03-25 – 2022-03-30 (×10): 50 mg via ORAL
  Filled 2022-03-25 (×3): qty 1
  Filled 2022-03-25: qty 2
  Filled 2022-03-25 (×2): qty 1
  Filled 2022-03-25 (×4): qty 2
  Filled 2022-03-25 (×2): qty 1
  Filled 2022-03-25 (×3): qty 2
  Filled 2022-03-25 (×2): qty 1

## 2022-03-25 MED ORDER — BENZTROPINE MESYLATE 1 MG PO TABS
0.5000 mg | ORAL_TABLET | Freq: Every day | ORAL | Status: DC
  Administered 2022-03-25 – 2022-03-30 (×5): 0.5 mg via ORAL
  Filled 2022-03-25 (×8): qty 1

## 2022-03-25 MED ORDER — CEPHALEXIN 250 MG PO CAPS
500.0000 mg | ORAL_CAPSULE | Freq: Two times a day (BID) | ORAL | Status: AC
  Administered 2022-03-25 – 2022-03-29 (×10): 500 mg via ORAL
  Filled 2022-03-25 (×10): qty 2

## 2022-03-25 MED ORDER — DIVALPROEX SODIUM 125 MG PO CSDR
250.0000 mg | DELAYED_RELEASE_CAPSULE | Freq: Two times a day (BID) | ORAL | Status: DC
  Administered 2022-03-25 – 2022-03-30 (×11): 250 mg via ORAL
  Filled 2022-03-25 (×11): qty 2

## 2022-03-25 MED ORDER — ARIPIPRAZOLE 10 MG PO TABS
20.0000 mg | ORAL_TABLET | Freq: Every day | ORAL | Status: DC
  Administered 2022-03-25 – 2022-03-29 (×5): 20 mg via ORAL
  Filled 2022-03-25 (×6): qty 2

## 2022-03-25 MED ORDER — MIRTAZAPINE 15 MG PO TBDP
15.0000 mg | ORAL_TABLET | Freq: Every day | ORAL | Status: DC
  Administered 2022-03-25 – 2022-03-29 (×5): 15 mg via ORAL
  Filled 2022-03-25 (×9): qty 1

## 2022-03-25 MED ORDER — PANTOPRAZOLE SODIUM 40 MG PO TBEC
40.0000 mg | DELAYED_RELEASE_TABLET | Freq: Every day | ORAL | Status: DC
  Administered 2022-03-25 – 2022-03-30 (×6): 40 mg via ORAL
  Filled 2022-03-25 (×6): qty 1

## 2022-03-25 MED ORDER — TRAZODONE HCL 50 MG PO TABS
75.0000 mg | ORAL_TABLET | Freq: Every day | ORAL | Status: DC
  Administered 2022-03-25 – 2022-03-29 (×5): 75 mg via ORAL
  Filled 2022-03-25 (×5): qty 2

## 2022-03-25 MED ORDER — ARIPIPRAZOLE 10 MG PO TABS
10.0000 mg | ORAL_TABLET | Freq: Every day | ORAL | Status: DC
Start: 2022-03-25 — End: 2022-03-30
  Administered 2022-03-25 – 2022-03-30 (×6): 10 mg via ORAL
  Filled 2022-03-25 (×7): qty 1

## 2022-03-25 MED ORDER — LORAZEPAM 0.5 MG PO TABS
0.5000 mg | ORAL_TABLET | Freq: Three times a day (TID) | ORAL | Status: DC | PRN
Start: 2022-03-25 — End: 2022-03-30
  Administered 2022-03-27 – 2022-03-30 (×6): 0.5 mg via ORAL
  Filled 2022-03-25 (×6): qty 1
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
## 2022-03-25 NOTE — ED Notes (Addendum)
Pt sitting up in bed and trying to eat lunch
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
## 2022-03-25 NOTE — ED Notes (Signed)
Pt resting comfortably at this time. Social worker to return in the AM. Only small amount of dark urine output earlier in the night and was sent for testing. Pt encouraged to drink throughout the night. Further urine labwork to be sent during next void.
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
## 2022-03-26 MED ORDER — ACETAMINOPHEN 325 MG PO TABS
650.0000 mg | ORAL_TABLET | Freq: Four times a day (QID) | ORAL | Status: DC | PRN
  Administered 2022-03-27 – 2022-03-28 (×3): 650 mg via ORAL
  Filled 2022-03-26 (×4): qty 2

## 2022-03-26 MED ORDER — ACETAMINOPHEN 325 MG PO TABS
650.0000 mg | ORAL_TABLET | Freq: Once | ORAL | Status: AC
  Administered 2022-03-26: 650 mg via ORAL
  Filled 2022-03-26: qty 2

## 2022-03-26 NOTE — ED Notes (Addendum)
Pt called her grandmother Wyman Songster [phone] and spoke with her for a few min and now is requesting to call her father Neysa Hotter [phone]
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
## 2022-03-26 NOTE — ED Notes (Signed)
Pt provided a hosp bed at this time. While exchanging beds pt ambulated to the restroom without assistance

## 2022-03-26 NOTE — ED Notes (Signed)
Pt c/o diarrhea at this time

## 2022-03-26 NOTE — Progress Notes (Signed)
CSW received a call from Cynda Acres with [facility], [phone]. CSW was told she will be the point of contact for patient. Kennon Rounds stated she has a list of 19 placements patient has been in before. Kennon Rounds stated with this being said it will not be easy to find a new placement for patient. Kennon Rounds stated she is going to assign another case manager to patient and she also has 2 other people helping with finding placement. Kennon Rounds stated that they are also sending referrals off for respite care as well until placement can be found.

## 2022-03-26 NOTE — ED Notes (Signed)
Called pharmacy tech and requested the meds from ED pharm tech at this time

## 2022-03-26 NOTE — ED Notes (Signed)
Provided pt with apple sauce, graham crackers, and ice cream at this time. She is currently speaking with her father on the phone

## 2022-03-27 ENCOUNTER — Emergency Department (HOSPITAL_COMMUNITY): Payer: Medicaid Other

## 2022-03-27 LAB — URINE CULTURE: Culture: 80000 — AB

## 2022-03-27 MED ORDER — IBUPROFEN 400 MG PO TABS
400.0000 mg | ORAL_TABLET | Freq: Four times a day (QID) | ORAL | Status: DC | PRN
  Administered 2022-03-28 – 2022-03-30 (×2): 400 mg via ORAL
  Filled 2022-03-27 (×3): qty 1

## 2022-03-27 MED ORDER — LOPERAMIDE HCL 2 MG PO CAPS
2.0000 mg | ORAL_CAPSULE | Freq: Four times a day (QID) | ORAL | Status: DC | PRN
  Filled 2022-03-27: qty 1

## 2022-03-27 MED ORDER — LOPERAMIDE HCL 2 MG PO CAPS
4.0000 mg | ORAL_CAPSULE | Freq: Once | ORAL | Status: AC
  Administered 2022-03-27: 4 mg via ORAL
  Filled 2022-03-27: qty 2

## 2022-03-27 NOTE — ED Notes (Signed)
RN went to give pt her 1000 meds. Pt arousable, mumbling to RN. Pt not wanting to take medication at this time. Pt resting in bed with eyes closed, rise and fall of the pt chest noted. Pt offered lemon lime soda. Pt declined. RN will attempt to give medication again.

## 2022-03-27 NOTE — ED Notes (Signed)
Pt given snacks and a drink

## 2022-03-27 NOTE — ED Notes (Signed)
Pt interactive making jokes with RN. Pt on the phone with grandmother.

## 2022-03-27 NOTE — ED Notes (Signed)
Pt resting in bed with no complaints at this time.

## 2022-03-27 NOTE — ED Notes (Signed)
Pt resting in bed with eyes closed, rise and fall noted. No complaints at this time.

## 2022-03-27 NOTE — ED Notes (Signed)
This RN brought pt breakfast tray. Pt currently resting in bed with eyes closed, in no apparent distress. Rise and fall of pt chest noted.

## 2022-03-27 NOTE — ED Notes (Signed)
Patient assisted to restroom. We attempted to call grandma, no answer. Patient was able to leave a voicemail. No needs or concerns expressed at this time.

## 2022-03-27 NOTE — ED Notes (Signed)
Patient resting in bed. Denies any pain. Patient expressed being very happy bc father called her earlier and told he was going to visit.

## 2022-03-27 NOTE — ED Notes (Signed)
ED provider at bedside.

## 2022-03-27 NOTE — ED Notes (Signed)
RN encouraging pt to eat and drink. Pt refusing at this time.

## 2022-03-27 NOTE — ED Notes (Signed)
RN attempted to give pt her daily medication. Pt shaking her head and mumbling. Pt turned over in bed, mumbled and covered her head with blanket.

## 2022-03-27 NOTE — ED Notes (Signed)
Patient encouraged to eat dinner plate. patient reports not being hungry. Patient was able to drink a soda.  Denies any abdominal pain.
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
## 2022-03-27 NOTE — ED Notes (Signed)
At bedside with pt and she is crying wishing to talk to who she calls mom Steward Drone attempted to reach [place] and there was no answer

## 2022-03-27 NOTE — ED Notes (Signed)
Pt awake and talking. Pt allowed RN to take vitals. Pt looking through menu.

## 2022-03-27 NOTE — ED Notes (Signed)
Pt is crying and upset stating that she wants her family. Pt also c/o pain all over. Provided prn meds. Called her father for her to talk to him for a few min. Provided pt with juice at this time. Attempted to get pt situated in bed to go to sleep

## 2022-03-28 MED ORDER — ONDANSETRON 4 MG PO TBDP
4.0000 mg | ORAL_TABLET | Freq: Once | ORAL | Status: AC
  Administered 2022-03-28: 4 mg via ORAL
  Filled 2022-03-28: qty 1

## 2022-03-28 MED ORDER — ACETAMINOPHEN 160 MG/5ML PO SOLN
500.0000 mg | Freq: Four times a day (QID) | ORAL | Status: DC | PRN
  Administered 2022-03-28 – 2022-03-29 (×2): 500 mg via ORAL
  Filled 2022-03-28 (×2): qty 20.3

## 2022-03-28 NOTE — ED Notes (Signed)
Patient assisted to shower by NT.

## 2022-03-28 NOTE — ED Notes (Signed)
RN went into pt room to see how she liked her lunch. Pt stated she did not like it. RN asked if she would like to order something else. Pt stated "everything upsets my stomach." Pt is denying stomach pain, but says she gets nauseous. RN made provider aware. Order for zofran PO put in.

## 2022-03-28 NOTE — ED Notes (Signed)
Per NT, patient experienced several emotional moments during shower and stating she was "anxious". When RN asked pt how the shower went, pt stated "good".  Patient provided PRN Ativan once settled into room.
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
## 2022-03-28 NOTE — ED Notes (Signed)
While this RN was sitting with patient. Patient admitted to this RN that she hears voices telling her to kill herself. When this nurse asked if patient wants to listen to the voices, patient denied thoughts of self harm or having a plan. Patient stated, "I ignore the voices, but thought you should know."

## 2022-03-28 NOTE — ED Notes (Signed)
This RN attempted to give pt some macncheese. Patient states "I want to eat it, but I don't know if my stomach will let me." Alexa RN also attempted to get ice cream for patient.

## 2022-03-28 NOTE — ED Notes (Signed)
This RN called pt's Child psychotherapist upon pt's request. Pt asked for social worker to pick up a couple of items when she is able to come visit.

## 2022-03-28 NOTE — ED Notes (Signed)
This RN went and brought pt medication. Pt and RN went for a walk and to the bathroom. Pt then ambulated back to room. RN and pt then created a list of pt favorite things. Pt expresses anxiety about being here. Pt reassured that she is safe. Pt offered PRN anxiety medication, pt declined.
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
## 2022-03-28 NOTE — ED Notes (Signed)
Patient requested medication for her agitation. This RN gave patient her PRN ativan. Patient also requested to go to the bathroom. This RN helped patient ambulate to the bathroom. Patient also asked for some soda, which this RN gave to patient.
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
## 2022-03-29 MED ORDER — MENTHOL 3 MG MT LOZG
1.0000 | LOZENGE | OROMUCOSAL | Status: DC | PRN
Start: 2022-03-29 — End: 2022-03-30

## 2022-03-29 NOTE — Progress Notes (Signed)
TOC CSW spoke with legal guardian, Livia Snellen [phone].  CSW inquired to [place] if a placement has been found, she stated no.  Cathlean Cower stated [facility] sent out a mass referral this morning in search of a placement, and she sent out a referral to two places, but no acceptance as of now.  CSW will check in until placement is found.  Jezabel Lecker Tarpley-Carter, MSW, LCSW-A Pronouns:  She/Her/Hers [facility] Clinical Social Worker Direct Number:  [phone] [email or web address]

## 2022-03-29 NOTE — ED Notes (Signed)
Speech therapist at bedside.

## 2022-03-29 NOTE — ED Notes (Addendum)
Speech eval done with plan to manage acid reflux at this time. MD notified of plan of treatment. Pt continues to refuse food at this time. States, "I feel a sharp pain to my throat every time I swallow." Pt has not eaten breakfast, lunch or dinner at this time. MD notified.
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
## 2022-03-29 NOTE — ED Notes (Signed)
Pt legal guardian at bedside to visit.

## 2022-03-29 NOTE — ED Notes (Signed)
RN reached out to speech therapist today. Spoke to [place] with speech who states she sent out messages to other therapists but she is unsure if speech eval would be completed today. Pending speech eval for diet order.

## 2022-03-29 NOTE — Progress Notes (Signed)
CSW contacted patients legal guardian patients from [facility], Livia Snellen [phone]. CSW left a message

## 2022-03-29 NOTE — ED Notes (Signed)
Pt made a phone call and is currently taking a shower. Will continue to monitor.

## 2022-03-29 NOTE — ED Notes (Signed)
Pt is now awake and taking her pills. Showered offered at this time. Pt refused. Staff encouraged pt to take a shower. Took meds with no issues. Safety precautions maintained. Will continue to monitor.
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
## 2022-03-29 NOTE — Progress Notes (Signed)
APS legal guardian, Livia Snellen [phone] called CSW back and stated there is no placement at this time but she sent two potential residences for patient to [facility], patients LME to look into. Cathlean Cower stated she has not received any updates from [facility] at this time.  Cathlean Cower also asked if patient can get a shower and change into scrubs. Cathlean Cower stated she will be at the hospital today to visit patient.

## 2022-03-29 NOTE — ED Notes (Signed)
Pt continues to be asleep. Easy to arouse but pt is still very drowsy. Will continue to monitor.

## 2022-03-29 NOTE — ED Notes (Signed)
Pt had some difficulty swallowing pills earlier when RN gave her scheduled meds. Family came and brought pt some snacks. Pt took a bite and refused the food as pt states, "I feel like something is stuck in my throat." Pt dad also states that pt voice has changed since he last seen pt. Will notify MD to place a swallowing eval order. Pt states difficulty swallowing started yesterday only. No drooling observed. Pt is able to complete sentences when talking. Will continue to monitor.

## 2022-03-30 ENCOUNTER — Other Ambulatory Visit (HOSPITAL_COMMUNITY): Payer: Self-pay

## 2022-03-30 MED ORDER — SUCRALFATE 1 GM/10ML PO SUSP
1.0000 g | Freq: Four times a day (QID) | ORAL | Status: DC
Start: 2022-03-30 — End: 2022-03-30
  Administered 2022-03-30 (×2): 1 g via ORAL
  Filled 2022-03-30 (×3): qty 10

## 2022-03-30 MED ORDER — CETIRIZINE HCL 10 MG PO TABS
10.0000 mg | ORAL_TABLET | Freq: Every day | ORAL | 0 refills | Status: AC
  Filled 2022-03-30: qty 14, 14d supply, fill #0

## 2022-03-30 MED ORDER — ARIPIPRAZOLE 10 MG PO TABS
10.0000 mg | ORAL_TABLET | Freq: Every day | ORAL | 0 refills | Status: AC
  Filled 2022-03-30: qty 14, 14d supply, fill #0

## 2022-03-30 MED ORDER — BENZTROPINE MESYLATE 0.5 MG PO TABS
0.5000 mg | ORAL_TABLET | Freq: Every day | ORAL | 0 refills | Status: AC
  Filled 2022-03-30: qty 14, 14d supply, fill #0

## 2022-03-30 MED ORDER — TRAZODONE HCL 50 MG PO TABS
75.0000 mg | ORAL_TABLET | Freq: Every day | ORAL | 0 refills | Status: AC
  Filled 2022-03-30: qty 21, 14d supply, fill #0

## 2022-03-30 MED ORDER — MIRTAZAPINE 15 MG PO TABS
15.0000 mg | ORAL_TABLET | Freq: Every day | ORAL | 0 refills | Status: AC
Start: 2022-03-30 — End: ?
  Filled 2022-03-30: qty 14, 14d supply, fill #0

## 2022-03-30 MED ORDER — ARIPIPRAZOLE 20 MG PO TABS
20.0000 mg | ORAL_TABLET | Freq: Every evening | ORAL | 0 refills | Status: AC
  Filled 2022-03-30: qty 14, 14d supply, fill #0

## 2022-03-30 MED ORDER — HALOPERIDOL 5 MG PO TABS
7.5000 mg | ORAL_TABLET | Freq: Every day | ORAL | 0 refills | Status: AC
  Filled 2022-03-30: qty 21, 14d supply, fill #0

## 2022-03-30 MED ORDER — DIVALPROEX SODIUM 125 MG PO CSDR
250.0000 mg | DELAYED_RELEASE_CAPSULE | Freq: Two times a day (BID) | ORAL | 0 refills | Status: AC
Start: 2022-03-30 — End: ?
  Filled 2022-03-30: qty 70, 14d supply, fill #0

## 2022-03-30 MED ORDER — FAMOTIDINE 20 MG PO TABS
20.0000 mg | ORAL_TABLET | Freq: Two times a day (BID) | ORAL | Status: DC | PRN
  Administered 2022-03-30: 20 mg via ORAL
  Filled 2022-03-30: qty 1

## 2022-03-30 NOTE — ED Notes (Signed)
Pt legal guardian is now in the ED to pick up pt. Guardian was given pt TOC meds and dc papers. Pt belongings returned to pt. Cleared for discharge by Dr.Dykstra.

## 2022-03-30 NOTE — ED Notes (Signed)
Pt continues to be asleep at this time. NO acute distress observed. Will continue to monitor.

## 2022-03-30 NOTE — Progress Notes (Signed)
CSW contacted legal guardian, Joseph Art and told her patient is up for discharge and someone will need to pick up patient today. Cathlean Cower stated she will work on who is going to pick patient up and give CSW a call back.

## 2022-03-30 NOTE — [place] FL2 (Signed)
[place] MEDICAID FL2 LEVEL OF CARE SCREENING TOOL     IDENTIFICATION  Patient Name: Deborah Hanson Birthdate: 04-14-2001 Sex: female Admission Date (Current Location): 03/24/2022  [facility] and [place] Number:  Producer, television/film/video and Address:  [facility] [place], [address], [place], [place] [address]      Provider Number: [id number]  Attending Physician Name and Address:  Default, Provider, MD  Relative Name and Phone Number:  Joseph Art (Legal Guardian)   [phone]    Current Level of Care: Hospital Recommended Level of Care: Other (Comment), Family Care Home (Group home, AFL) Prior Approval Number:    Date Approved/Denied:   PASRR Number:    Discharge Plan: Other (Comment) (Group Home, AFL, Family Care Home)    Current Diagnoses: Patient Active Problem List   Diagnosis Date Noted   Autism spectrum disorder 11/27/2021    Orientation RESPIRATION BLADDER Height & Weight     Time, Self, Situation, Place  Normal Continent Weight: 145 lb 12.8 oz (66.1 kg) Height:   5'0  BEHAVIORAL SYMPTOMS/MOOD NEUROLOGICAL BOWEL NUTRITION STATUS      Continent Diet (Regular)  AMBULATORY STATUS COMMUNICATION OF NEEDS Skin   Limited Assist Verbally Normal                       Personal Care Assistance Level of Assistance  Bathing, Feeding, Dressing Bathing Assistance: Limited assistance Feeding assistance: Independent Dressing Assistance: Limited assistance     Functional Limitations Info  Sight, Hearing, Speech Sight Info: Impaired (wears glasses) Hearing Info: Adequate Speech Info: Adequate    SPECIAL CARE FACTORS FREQUENCY                       Contractures Contractures Info: Not present    Additional Factors Info  Code Status, Allergies Code Status Info: Not on file Allergies Info: Peanut Oil, Lactose Intolerance (gi)           Current Medications (03/30/2022):  This is the current hospital active medication  list Current Facility-Administered Medications  Medication Dose Route Frequency Provider Last Rate Last Admin   acetaminophen (TYLENOL) 160 MG/5ML solution 500 mg  500 mg Oral Q6H PRN Army Melia A, PA-C   500 mg at 03/29/22 0406   acetaminophen (TYLENOL) tablet 650 mg  650 mg Oral Q6H PRN Cheryll Cockayne, MD   650 mg at 03/28/22 0210   ARIPiprazole (ABILIFY) tablet 10 mg  10 mg Oral Daily Loeffler, Grace C, PA-C   10 mg at 03/30/22 1102   ARIPiprazole (ABILIFY) tablet 20 mg  20 mg Oral QHS Loeffler, Grace C, PA-C   20 mg at 03/29/22 2200   benztropine (COGENTIN) tablet 0.5 mg  0.5 mg Oral Daily Loeffler, Grace C, PA-C   0.5 mg at 03/30/22 1102   divalproex (DEPAKOTE SPRINKLE) capsule 250 mg  250 mg Oral BID Loeffler, Grace C, PA-C   250 mg at 03/30/22 1102   famotidine (PEPCID) tablet 20 mg  20 mg Oral BID PRN Melene Plan, DO   20 mg at 03/30/22 1117   haloperidol (HALDOL) tablet 7.5 mg  7.5 mg Oral Daily Loeffler, Grace C, PA-C   7.5 mg at 03/30/22 1101   hydrOXYzine (ATARAX) tablet 50 mg  50 mg Oral BID Loeffler, Grace C, PA-C   50 mg at 03/30/22 1101   ibuprofen (ADVIL) tablet 400 mg  400 mg Oral Q6H PRN Dione Booze, MD   400 mg at  03/30/22 1103   loperamide (IMODIUM) capsule 2 mg  2 mg Oral QID PRN Dione Booze, MD       LORazepam (ATIVAN) tablet 0.5 mg  0.5 mg Oral TID PRN Loeffler, Grace C, PA-C   0.5 mg at 03/30/22 1101   menthol-cetylpyridinium (CEPACOL) lozenge 3 mg  1 lozenge Oral PRN Gilda Crease, MD       mirtazapine (REMERON SOL-TAB) disintegrating tablet 15 mg  15 mg Oral QHS Loeffler, Grace C, PA-C   15 mg at 03/29/22 2201   pantoprazole (PROTONIX) EC tablet 40 mg  40 mg Oral Daily Loeffler, Grace C, PA-C   40 mg at 03/30/22 1101   sucralfate (CARAFATE) 1 GM/10ML suspension 1 g  1 g Oral QID Melene Plan, DO   1 g at 03/30/22 1256   traZODone (DESYREL) tablet 75 mg  75 mg Oral QHS Loeffler, Grace C, PA-C   75 mg at 03/29/22 2201   Current Outpatient Medications   Medication Sig Dispense Refill   ARIPiprazole (ABILIFY) 10 MG tablet Take 10 mg by mouth daily.     ARIPiprazole (ABILIFY) 20 MG tablet Take 20 mg by mouth at bedtime.     ARIPiprazole ER (ABILIFY MAINTENA) 400 MG SRER injection Inject 400 mg into the muscle every 28 (twenty-eight) days.     benztropine (COGENTIN) 0.5 MG tablet Take 0.5 mg by mouth daily.     divalproex (DEPAKOTE SPRINKLE) 125 MG capsule Take 250 mg by mouth 2 (two) times daily. Take 250 mg by mouth every morning and 750 mg at bedtime     haloperidol (HALDOL) 5 MG tablet Take 7.5 mg by mouth daily. May repeat at 1700 if needed for aggression     hydrOXYzine (VISTARIL) 50 MG capsule Take 50 mg by mouth 2 (two) times daily.     LORazepam (ATIVAN) 0.5 MG tablet Take 0.5 mg by mouth 3 (three) times daily as needed for anxiety (aggitation).     mirtazapine (REMERON SOL-TAB) 15 MG disintegrating tablet Take 15 mg by mouth at bedtime.     omeprazole (PRILOSEC) 20 MG capsule Take 20 mg by mouth daily.     traZODone (DESYREL) 50 MG tablet Take 75 mg by mouth at bedtime.     cetirizine (ZYRTEC ALLERGY) 10 MG tablet Take 1 tablet (10 mg total) by mouth at bedtime. (Patient not taking: Reported on 03/25/2022) 90 tablet 1   divalproex (DEPAKOTE) 250 MG DR tablet Take 250 mg by mouth 2 (two) times daily. (Patient not taking: Reported on 03/25/2022)     FLUoxetine (PROZAC) 20 MG capsule Take by mouth daily. (Patient not taking: Reported on 03/25/2022)     fluticasone (FLONASE) 50 MCG/ACT nasal spray Place 2 sprays into both nostrils daily. (Patient not taking: Reported on 11/30/2021) 48 mL 1   nitrofurantoin, macrocrystal-monohydrate, (MACROBID) 100 MG capsule Take 1 capsule (100 mg total) by mouth 2 (two) times daily. (Patient not taking: Reported on 03/25/2022) 10 capsule 0   ondansetron (ZOFRAN) 4 MG tablet Take 1 tablet (4 mg total) by mouth every 6 (six) hours. (Patient not taking: Reported on 03/25/2022) 12 tablet 0     Discharge  Medications: Please see discharge summary for a list of discharge medications.  Relevant Imaging Results:  Relevant Lab Results:   Additional Information    Susa Simmonds, LCSWA

## 2022-03-30 NOTE — ED Notes (Signed)
TOC is working on pt transportation and place to go home today. Pt has a legal guardian and has biological parents but pt lives in a group home and unable to go back there. Pending discharge at this time.

## 2022-03-30 NOTE — ED Notes (Signed)
Breakfast order placed ?

## 2022-03-30 NOTE — Progress Notes (Signed)
CSW received a call from legal guardian, Joseph Art who is requesting a FL2. CSW stated she can scan and email one. CSW also asked who is picking patient up. Cathlean Cower stated she does not know at this time.

## 2022-03-30 NOTE — ED Notes (Signed)
Pt states pepcid did not help with burning to throat. Refuses to eat lunch at this time. Ate a few bites of breakfast with speech therapist at bedside. Pt continues to endorse pain on swallowing. Carafate given at this time. Will continue to monitor.

## 2022-03-30 NOTE — ED Notes (Signed)
Pepcid PO given for acid reflux. Speech therapist at bedside this time assessing pt swallowing. Will continue to monitor.

## 2022-03-30 NOTE — ED Notes (Signed)
Speech therapist at bedside this time to assess pt swallowing. Recommended to get MD to order meds for acid reflux. Will notify Dr.Floyd. Will continue to monitor.

## 2022-03-30 NOTE — ED Notes (Signed)
Waiting for TOC meds from pharmacy. SW states pt legal guardian is coming to pick up pt.

## 2022-03-30 NOTE — ED Provider Notes (Addendum)
Brief update note  Ms. Deborah Hanson is a 21 year old lady who has been both medically cleared and psychiatrically cleared, awaiting pickup from family.  The nurse requested I evaluate patient regarding ongoing difficulty swallowing.  I reviewed Dr. Piedad Climes note from earlier today.  He states patient is ready for discharge both medically and psychiatrically.  I also reviewed SLP notes which document patient has tolerated p.o. albeit small portions and quantities.  On physical examination, her throat appears grossly normal, she does not have any obvious swelling or erythema or exudates in her oropharynx.  Her neck is supple and does not have any overlying erythema or swelling.  Personally witnessed patient drinking small sips of liquid without difficulty.  Out of an abundance of precaution while we are waiting for guardian to arrive, will check CT soft tissue neck for further investigation given this persistent symptom.  Based on my physical exam and review of SLP notes I still believe patient can be discharged this evening.  Would recommend follow-up with PCP, GI and ENT.  We will place this in her discharge paperwork as well.   Guardians have arrived and ready to take patient home. I reassessed patient, she remains well appearing. Based on SLP eval, physical exam, and current ability to tolerate PO, I believe she can continue further work up for this medical complaint on an out patient basis.   Milagros Loll, MD 03/30/22 1759
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
## 2022-03-30 NOTE — ED Notes (Signed)
MD came at bedside to assess pt throat as pt continues to endorse difficulty swallowing. Will continue to monitor.

## 2022-03-30 NOTE — ED Notes (Signed)
Reached out to SW at this time regarding discharge dispo. Waiting for call back from pt legal guardian regarding dispo. Will continue to monitor.

## 2022-05-18 ENCOUNTER — Ambulatory Visit (INDEPENDENT_AMBULATORY_CARE_PROVIDER_SITE_OTHER): Payer: Medicaid Other | Admitting: Pediatrics

## 2022-05-21 NOTE — Progress Notes (Signed)
Opened in error- patient hospitalized and unable to make appointment

## 2022-05-22 ENCOUNTER — Ambulatory Visit (HOSPITAL_COMMUNITY)
Admission: AD | Admit: 2022-05-22 | Discharge: 2022-05-22 | Disposition: A | Discharge status: Home / Self Care | Payer: No Typology Code available for payment source | Attending: Psychiatry | Admitting: Psychiatry

## 2022-05-22 ENCOUNTER — Ambulatory Visit (HOSPITAL_COMMUNITY)
Admission: AD | Admit: 2022-05-22 | Payer: Medicaid Other | Source: Other Acute Inpatient Hospital | Admitting: Psychiatry

## 2022-05-22 ENCOUNTER — Ambulatory Visit (HOSPITAL_COMMUNITY)
Admission: EM | Admit: 2022-05-22 | Discharge: 2022-05-22 | Disposition: A | Discharge status: Home / Self Care | Payer: No Typology Code available for payment source | Attending: Psychiatry | Admitting: Psychiatry

## 2022-05-22 DIAGNOSIS — F603 Borderline personality disorder: Secondary | ICD-10-CM | POA: Insufficient documentation

## 2022-05-22 DIAGNOSIS — F331 Major depressive disorder, recurrent, moderate: Secondary | ICD-10-CM | POA: Insufficient documentation

## 2022-05-22 DIAGNOSIS — F79 Unspecified intellectual disabilities: Secondary | ICD-10-CM | POA: Insufficient documentation

## 2022-05-22 DIAGNOSIS — F840 Autistic disorder: Secondary | ICD-10-CM

## 2022-05-22 NOTE — ED Notes (Signed)
AVS instructions given to group home administrator - verbalizes understanding.  Patient walked out of lobby with no sxs of distress

## 2022-05-22 NOTE — Discharge Instructions (Addendum)
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
## 2022-05-22 NOTE — ED Provider Notes (Signed)
Behavioral Health Urgent Care Medical Screening Exam  Patient Name: Deborah Hanson MRN: [id number] Date of Evaluation: 05/22/22 Chief Complaint:   Diagnosis:  Final diagnoses:  Borderline personality disorder (HCC)  Intellectual disability  Moderate episode of recurrent major depressive disorder (HCC)    History of Present illness: Deborah Hanson is a 21 y.o. female with history of IDD, borderline personality disorder and MDD presenting to [facility] voluntarily with group home program [facility] [place]. Patient has a legal guardian with DSS-Guardian Verlon Au Ms. Earlene Plater reports that patient was discharged from [facility] ED today after being inpatient from 05/16/22 to 05/22/22. Patient was admitted to [facility] ED for suicidal ideations and aggressive behaviors towards staff. Patient has only been at this [facility] less than a month. The grouphome director states that patient will be discharged due to patient's behaviors towards staff, refusal to take medications and refusal to attend the day program.   Patient states that she has been missing family and her father specifically. Patient states that it has been a long time since she has seen her family.   Psychiatric Specialty Exam  Presentation  General Appearance:Casual  Eye Contact:Fair  Speech:Clear and Coherent  Speech Volume:Normal  Handedness:Right   Mood and Affect  Mood:Depressed  Affect:Appropriate   Thought Process  Thought Processes:Coherent  Descriptions of Associations:Intact  Orientation:Full (Time, Place and Person)  Thought Content:WDL  Diagnosis of Schizophrenia or Schizoaffective disorder in past: No   Hallucinations:None  Ideas of Reference:None  Suicidal Thoughts:No  Homicidal Thoughts:No   Sensorium  Memory:Immediate Fair; Recent Fair; Remote Fair  Judgment:Fair  Insight:Poor   Executive Functions  Concentration:Fair  Attention Span:Fair  Recall:Fair  Fund  of Knowledge:Fair  Language:Fair   Psychomotor Activity  Psychomotor Activity:Normal   Assets  Assets:Desire for Improvement; Manufacturing systems engineer; Housing; Physical Health; Social Support   Sleep  Sleep:Good  Number of hours: -1   No data recorded  Physical Exam: Physical Exam ROS Blood pressure 127/86, pulse (!) 114, temperature 98.7 F (37.1 C), temperature source Oral, resp. rate (!) 22, SpO2 99 %. There is no height or weight on file to calculate BMI.  Musculoskeletal: Strength & Muscle Tone: within normal limits Gait & Station: normal Patient leans: N/A   [facility] MSE Discharge Disposition for Follow up and Recommendations: Based on my evaluation the patient does not appear to have an emergency medical condition and can be discharged with resources and follow up care in outpatient services for Medication Management and Individual Therapy   Jasper Riling, NP 05/22/2022, 10:50 PM
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
## 2022-06-11 ENCOUNTER — Other Ambulatory Visit (HOSPITAL_COMMUNITY): Payer: Self-pay
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
## 2022-08-03 ENCOUNTER — Encounter (INDEPENDENT_AMBULATORY_CARE_PROVIDER_SITE_OTHER): Payer: Self-pay | Admitting: Pediatrics

## 2022-08-03 ENCOUNTER — Ambulatory Visit (INDEPENDENT_AMBULATORY_CARE_PROVIDER_SITE_OTHER): Payer: Medicaid Other | Admitting: Pediatrics

## 2022-08-03 VITALS — BP 112/72 | HR 96 | Temp 99.1°F | Wt 158.6 lb

## 2022-08-03 DIAGNOSIS — T7411XA Adult physical abuse, confirmed, initial encounter: Secondary | ICD-10-CM

## 2022-08-03 DIAGNOSIS — Z708 Other sex counseling: Secondary | ICD-10-CM | POA: Diagnosis not present

## 2022-08-03 DIAGNOSIS — T7421XA Adult sexual abuse, confirmed, initial encounter: Secondary | ICD-10-CM

## 2022-08-03 DIAGNOSIS — Z593 Problems related to living in residential institution: Secondary | ICD-10-CM

## 2022-08-03 DIAGNOSIS — Z3202 Encounter for pregnancy test, result negative: Secondary | ICD-10-CM | POA: Diagnosis not present

## 2022-08-04 LAB — TRICHOMONAS VAGINALIS, PROBE AMP: Trichomonas vaginalis RNA: NOT DETECTED

## 2022-08-04 LAB — C. TRACHOMATIS/N. GONORRHOEAE RNA
C. trachomatis RNA, TMA: NOT DETECTED
N. gonorrhoeae RNA, TMA: NOT DETECTED
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
## 2022-08-16 LAB — POCT URINE PREGNANCY: Preg Test, Ur: NEGATIVE
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
## 2024-07-12 ENCOUNTER — Ambulatory Visit
Admission: EM | Admit: 2024-07-12 | Discharge: 2024-07-12 | Disposition: A | Discharge status: Home / Self Care | Payer: MEDICAID | Attending: Internal Medicine | Admitting: Internal Medicine

## 2024-07-12 ENCOUNTER — Ambulatory Visit: Payer: MEDICAID | Admitting: Radiology

## 2024-07-12 DIAGNOSIS — F840 Autistic disorder: Secondary | ICD-10-CM

## 2024-07-12 DIAGNOSIS — S42291A Other displaced fracture of upper end of right humerus, initial encounter for closed fracture: Secondary | ICD-10-CM

## 2024-07-12 DIAGNOSIS — S40021A Contusion of right upper arm, initial encounter: Secondary | ICD-10-CM

## 2024-07-12 MED ORDER — KETOROLAC TROMETHAMINE 10 MG PO TABS: 10.0000 mg | ORAL_TABLET | Freq: Four times a day (QID) | ORAL | 0 refills | Status: AC | PRN

## 2024-07-12 MED ORDER — KETOROLAC TROMETHAMINE 30 MG/ML IJ SOLN
30.0000 mg | Freq: Once | INTRAMUSCULAR | Status: AC
  Administered 2024-07-12: 30 mg via INTRAMUSCULAR
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
## 2024-07-12 NOTE — Discharge Instructions (Addendum)
 Deborah Hanson broke her humerus bone in her arm today.   Please have her wear the sling we provided in the clinic today at all times unless she is asleep, bathing, or unsupervised.  We gave her an injection called ketorolac today. This is an antiinflammatory medication similar to ibuprofen  or aleve.  You may give her ketorolac tablest 1 10mg  tablet every 6 hours as needed for pain starting tomorrow evening.   Please apply ice to the area of pain and swelling 20 minutes on 20 minutes off as needed.   Schedule a follow-up appointment with Dr. Kendal orthpedic surgeon listed on your paperwork.   If Deborah Hanson starts feeling numbness or tingling in her right hand, or if the pain becomes so severe that she is unable to sleep, please bring Deborah Hanson to the ER for further evaluation.
